# Patient Record
Sex: Female | Born: 1945 | Race: Black or African American | Hispanic: No
Health system: Southern US, Community
[De-identification: ages and names within clinical notes are randomized; demographics above are authoritative.]

---

## 2004-03-13 ENCOUNTER — Ambulatory Visit: Payer: Self-pay

## 2004-10-07 ENCOUNTER — Ambulatory Visit: Payer: Self-pay

## 2004-11-16 ENCOUNTER — Ambulatory Visit: Payer: Self-pay

## 2004-12-29 ENCOUNTER — Ambulatory Visit: Payer: Self-pay | Admitting: Rheumatology

## 2005-03-05 ENCOUNTER — Ambulatory Visit: Payer: Self-pay | Admitting: Gastroenterology

## 2005-06-03 ENCOUNTER — Ambulatory Visit: Payer: Self-pay | Admitting: Gastroenterology

## 2006-01-06 ENCOUNTER — Ambulatory Visit: Payer: Self-pay | Admitting: Specialist

## 2007-10-03 ENCOUNTER — Ambulatory Visit: Payer: Self-pay | Admitting: Family Medicine

## 2008-03-11 ENCOUNTER — Ambulatory Visit: Payer: Self-pay | Admitting: Family Medicine

## 2009-03-13 ENCOUNTER — Ambulatory Visit: Payer: Self-pay | Admitting: Specialist

## 2009-04-16 ENCOUNTER — Ambulatory Visit: Payer: Self-pay | Admitting: Family Medicine

## 2009-05-02 ENCOUNTER — Ambulatory Visit: Payer: Self-pay | Admitting: Family Medicine

## 2009-07-09 ENCOUNTER — Ambulatory Visit: Payer: Self-pay | Admitting: Specialist

## 2009-07-11 ENCOUNTER — Ambulatory Visit: Payer: Self-pay | Admitting: Specialist

## 2009-08-19 ENCOUNTER — Ambulatory Visit: Payer: Self-pay | Admitting: Family Medicine

## 2009-11-17 ENCOUNTER — Ambulatory Visit: Payer: Self-pay | Admitting: Specialist

## 2010-01-13 ENCOUNTER — Ambulatory Visit: Payer: Self-pay | Admitting: Specialist

## 2011-04-22 ENCOUNTER — Ambulatory Visit: Payer: Self-pay | Admitting: Internal Medicine

## 2011-09-14 LAB — COMPREHENSIVE METABOLIC PANEL
Anion Gap: 13 (ref 7–16)
BUN: 11 mg/dL (ref 7–18)
Bilirubin,Total: 0.7 mg/dL (ref 0.2–1.0)
Calcium, Total: 8.7 mg/dL (ref 8.5–10.1)
EGFR (Non-African Amer.): >60
Glucose: 126 mg/dL — ABNORMAL HIGH (ref 65–99)
Osmolality: 267 (ref 275–301)
SGOT(AST): 17 U/L (ref 15–37)
Sodium: 133 mmol/L — ABNORMAL LOW (ref 136–145)

## 2011-09-14 LAB — CBC
HCT: 38 % (ref 35.0–47.0)
MCV: 78 fL — ABNORMAL LOW (ref 80–100)
WBC: 8.9 10*3/uL (ref 3.6–11.0)

## 2011-09-14 LAB — CK TOTAL AND CKMB (NOT AT ARMC): CK-MB: <0.5 ng/mL — ABNORMAL LOW (ref 0.5–3.6)

## 2011-09-15 ENCOUNTER — Inpatient Hospital Stay: Payer: Self-pay | Admitting: *Deleted

## 2011-09-15 LAB — URINALYSIS, COMPLETE
Blood: NEGATIVE
Protein: 100
Specific Gravity: 1.029 (ref 1.003–1.030)
Squamous Epithelial: 1
WBC UR: 13 /HPF (ref 0–5)

## 2011-09-16 LAB — COMPREHENSIVE METABOLIC PANEL
Anion Gap: 11 (ref 7–16)
BUN: 8 mg/dL (ref 7–18)
Bilirubin,Total: 0.4 mg/dL (ref 0.2–1.0)
Chloride: 99 mmol/L (ref 98–107)
Co2: 25 mmol/L (ref 21–32)
Creatinine: 0.64 mg/dL (ref 0.60–1.30)
EGFR (African American): >60
Glucose: 97 mg/dL (ref 65–99)
Osmolality: 268 (ref 275–301)
Potassium: 3 mmol/L — ABNORMAL LOW (ref 3.5–5.1)
SGOT(AST): 13 U/L — ABNORMAL LOW (ref 15–37)
Sodium: 135 mmol/L — ABNORMAL LOW (ref 136–145)
Total Protein: 6.5 g/dL (ref 6.4–8.2)

## 2011-09-16 LAB — CBC WITH DIFFERENTIAL/PLATELET
Eosinophil #: 0.1 10*3/uL (ref 0.0–0.7)
HCT: 32.4 % — ABNORMAL LOW (ref 35.0–47.0)
HGB: 10.5 g/dL — ABNORMAL LOW (ref 12.0–16.0)
Lymphocyte %: 15.6 %
MCH: 25 pg — ABNORMAL LOW (ref 26.0–34.0)
MCHC: 32.4 g/dL (ref 32.0–36.0)
MCV: 77 fL — ABNORMAL LOW (ref 80–100)
RBC: 4.2 10*6/uL (ref 3.80–5.20)
RDW: 15.7 % — ABNORMAL HIGH (ref 11.5–14.5)
WBC: 5.3 10*3/uL (ref 3.6–11.0)

## 2011-09-16 LAB — OCCULT BLOOD X 1 CARD TO LAB, STOOL: Occult Blood, Feces: NEGATIVE

## 2011-09-16 LAB — IRON AND TIBC
Iron Bind.Cap.(Total): 213 ug/dL — ABNORMAL LOW (ref 250–450)
Iron Saturation: 20 %
Unbound Iron-Bind.Cap.: 171 ug/dL

## 2011-09-16 LAB — URINE CULTURE

## 2011-09-19 LAB — BASIC METABOLIC PANEL
Anion Gap: 10 (ref 7–16)
BUN: 6 mg/dL — ABNORMAL LOW (ref 7–18)
Calcium, Total: 8.8 mg/dL (ref 8.5–10.1)
Chloride: 102 mmol/L (ref 98–107)
Co2: 20 mmol/L — ABNORMAL LOW (ref 21–32)
Creatinine: 0.61 mg/dL (ref 0.60–1.30)
EGFR (African American): >60
EGFR (Non-African Amer.): >60
Glucose: 92 mg/dL (ref 65–99)

## 2011-09-20 LAB — CULTURE, BLOOD (SINGLE)

## 2011-09-20 LAB — HEMOGLOBIN: HGB: 10.6 g/dL — ABNORMAL LOW (ref 12.0–16.0)

## 2012-01-26 ENCOUNTER — Ambulatory Visit: Payer: Self-pay | Admitting: Ophthalmology

## 2012-03-08 ENCOUNTER — Ambulatory Visit: Payer: Self-pay | Admitting: Ophthalmology

## 2012-07-24 ENCOUNTER — Inpatient Hospital Stay: Payer: Self-pay | Admitting: Specialist

## 2012-07-24 LAB — COMPREHENSIVE METABOLIC PANEL
Anion Gap: 9 (ref 7–16)
Bilirubin,Total: 0.4 mg/dL (ref 0.2–1.0)
Calcium, Total: 9 mg/dL (ref 8.5–10.1)
Co2: 25 mmol/L (ref 21–32)
EGFR (Non-African Amer.): >60
Glucose: 86 mg/dL (ref 65–99)
Osmolality: 270 (ref 275–301)
Potassium: 3.4 mmol/L — ABNORMAL LOW (ref 3.5–5.1)
SGOT(AST): 21 U/L (ref 15–37)
SGPT (ALT): 13 U/L (ref 12–78)
Sodium: 135 mmol/L — ABNORMAL LOW (ref 136–145)
Total Protein: 8.3 g/dL — ABNORMAL HIGH (ref 6.4–8.2)

## 2012-07-24 LAB — CBC WITH DIFFERENTIAL/PLATELET
Basophil %: 0.2 %
Eosinophil #: 0 10*3/uL (ref 0.0–0.7)
HCT: 45.5 % (ref 35.0–47.0)
HGB: 15 g/dL (ref 12.0–16.0)
Lymphocyte %: 4.2 %
MCHC: 32.9 g/dL (ref 32.0–36.0)
MCV: 82 fL (ref 80–100)
Monocyte %: 3 %
Neutrophil %: 92.5 %
RDW: 15.3 % — ABNORMAL HIGH (ref 11.5–14.5)

## 2012-07-24 LAB — URINALYSIS, COMPLETE
Blood: NEGATIVE
Nitrite: NEGATIVE
RBC,UR: 2 /HPF (ref 0–5)
Specific Gravity: 1.027 (ref 1.003–1.030)
WBC UR: 16 /HPF (ref 0–5)

## 2012-07-25 LAB — CBC WITH DIFFERENTIAL/PLATELET
Basophil %: 0.3 %
Eosinophil #: 0.1 10*3/uL (ref 0.0–0.7)
Eosinophil %: 0.4 %
HGB: 9.5 g/dL — ABNORMAL LOW (ref 12.0–16.0)
Lymphocyte #: 1.3 10*3/uL (ref 1.0–3.6)
MCH: 26 pg (ref 26.0–34.0)
MCV: 81 fL (ref 80–100)
Neutrophil #: 12 10*3/uL — ABNORMAL HIGH (ref 1.4–6.5)
Neutrophil %: 85.7 %
Platelet: 283 10*3/uL (ref 150–440)
RBC: 3.67 10*6/uL — ABNORMAL LOW (ref 3.80–5.20)

## 2012-07-25 LAB — BASIC METABOLIC PANEL
Anion Gap: 9 (ref 7–16)
BUN: 8 mg/dL (ref 7–18)
Calcium, Total: 8.2 mg/dL — ABNORMAL LOW (ref 8.5–10.1)
Chloride: 106 mmol/L (ref 98–107)
Co2: 23 mmol/L (ref 21–32)
Glucose: 52 mg/dL — ABNORMAL LOW (ref 65–99)
Potassium: 3.6 mmol/L (ref 3.5–5.1)
Sodium: 138 mmol/L (ref 136–145)

## 2012-07-26 LAB — CBC WITH DIFFERENTIAL/PLATELET
Basophil #: 0 10*3/uL (ref 0.0–0.1)
HCT: 29.2 % — ABNORMAL LOW (ref 35.0–47.0)
HGB: 9.5 g/dL — ABNORMAL LOW (ref 12.0–16.0)
MCH: 26.4 pg (ref 26.0–34.0)
MCHC: 32.7 g/dL (ref 32.0–36.0)
Monocyte #: 0.8 x10 3/mm (ref 0.2–0.9)
Neutrophil #: 7.5 10*3/uL — ABNORMAL HIGH (ref 1.4–6.5)
Platelet: 294 10*3/uL (ref 150–440)
RBC: 3.6 10*6/uL — ABNORMAL LOW (ref 3.80–5.20)
RDW: 15.2 % — ABNORMAL HIGH (ref 11.5–14.5)
WBC: 9.5 10*3/uL (ref 3.6–11.0)

## 2012-07-27 LAB — CLOSTRIDIUM DIFFICILE BY PCR

## 2012-07-27 LAB — URINE CULTURE

## 2012-07-30 LAB — CULTURE, BLOOD (SINGLE)

## 2012-08-01 LAB — CULTURE, BLOOD (SINGLE)

## 2012-08-06 ENCOUNTER — Inpatient Hospital Stay: Payer: Self-pay | Admitting: Internal Medicine

## 2012-08-06 LAB — COMPREHENSIVE METABOLIC PANEL
Albumin: 3.3 g/dL — ABNORMAL LOW (ref 3.4–5.0)
Alkaline Phosphatase: 49 U/L — ABNORMAL LOW (ref 50–136)
Anion Gap: 13 (ref 7–16)
BUN: 14 mg/dL (ref 7–18)
Bilirubin,Total: 0.5 mg/dL (ref 0.2–1.0)
Calcium, Total: 9.1 mg/dL (ref 8.5–10.1)
Chloride: 96 mmol/L — ABNORMAL LOW (ref 98–107)
Co2: 21 mmol/L (ref 21–32)
EGFR (African American): >60
EGFR (Non-African Amer.): >60
Potassium: 3.5 mmol/L (ref 3.5–5.1)
SGOT(AST): 15 U/L (ref 15–37)
SGPT (ALT): 14 U/L (ref 12–78)
Total Protein: 7.8 g/dL (ref 6.4–8.2)

## 2012-08-06 LAB — URINALYSIS, COMPLETE
Bilirubin,UR: NEGATIVE
Nitrite: NEGATIVE
RBC,UR: 2 /HPF (ref 0–5)

## 2012-08-06 LAB — CBC
HCT: 37.9 % (ref 35.0–47.0)
MCH: 26.2 pg (ref 26.0–34.0)
MCHC: 32.3 g/dL (ref 32.0–36.0)
MCV: 81 fL (ref 80–100)
Platelet: 346 10*3/uL (ref 150–440)
RBC: 4.68 10*6/uL (ref 3.80–5.20)
RDW: 16.3 % — ABNORMAL HIGH (ref 11.5–14.5)
WBC: 6.9 10*3/uL (ref 3.6–11.0)

## 2012-08-06 LAB — TROPONIN I: Troponin-I: <0.02 ng/mL

## 2012-08-07 LAB — BASIC METABOLIC PANEL
Anion Gap: 6 — ABNORMAL LOW (ref 7–16)
BUN: 9 mg/dL (ref 7–18)
Creatinine: 0.56 mg/dL — ABNORMAL LOW (ref 0.60–1.30)
Osmolality: 264 (ref 275–301)
Sodium: 133 mmol/L — ABNORMAL LOW (ref 136–145)

## 2012-08-07 LAB — MAGNESIUM: Magnesium: 1.8 mg/dL

## 2012-08-12 ENCOUNTER — Emergency Department: Payer: Self-pay | Admitting: Emergency Medicine

## 2012-08-12 LAB — URINALYSIS, COMPLETE
Bilirubin,UR: NEGATIVE
Glucose,UR: >=500 mg/dL (ref 0–75)
Leukocyte Esterase: NEGATIVE
Ph: 7 (ref 4.5–8.0)
Protein: NEGATIVE
RBC,UR: NONE SEEN /HPF (ref 0–5)
Specific Gravity: 1.009 (ref 1.003–1.030)
Squamous Epithelial: 3
WBC UR: 1 /HPF (ref 0–5)

## 2012-08-12 LAB — COMPREHENSIVE METABOLIC PANEL
Albumin: 3.3 g/dL — ABNORMAL LOW (ref 3.4–5.0)
Alkaline Phosphatase: 59 U/L (ref 50–136)
Anion Gap: 6 — ABNORMAL LOW (ref 7–16)
BUN: 8 mg/dL (ref 7–18)
Bilirubin,Total: 0.4 mg/dL (ref 0.2–1.0)
Calcium, Total: 8.7 mg/dL (ref 8.5–10.1)
Chloride: 99 mmol/L (ref 98–107)
Creatinine: 0.57 mg/dL — ABNORMAL LOW (ref 0.60–1.30)
EGFR (African American): >60
Glucose: 145 mg/dL — ABNORMAL HIGH (ref 65–99)
Osmolality: 267 (ref 275–301)
Potassium: 3.3 mmol/L — ABNORMAL LOW (ref 3.5–5.1)
SGOT(AST): 20 U/L (ref 15–37)
SGPT (ALT): 12 U/L (ref 12–78)
Sodium: 133 mmol/L — ABNORMAL LOW (ref 136–145)

## 2012-08-12 LAB — CBC
MCHC: 32.6 g/dL (ref 32.0–36.0)
MCV: 82 fL (ref 80–100)
Platelet: 234 10*3/uL (ref 150–440)
RBC: 4.59 10*6/uL (ref 3.80–5.20)
WBC: 7.5 10*3/uL (ref 3.6–11.0)

## 2012-08-30 ENCOUNTER — Other Ambulatory Visit: Payer: Self-pay | Admitting: Unknown Physician Specialty

## 2012-08-30 DIAGNOSIS — R1310 Dysphagia, unspecified: Secondary | ICD-10-CM

## 2012-08-31 ENCOUNTER — Ambulatory Visit
Admission: RE | Admit: 2012-08-31 | Discharge: 2012-08-31 | Disposition: A | Discharge status: Home / Self Care | Payer: Medicare Other | Source: Ambulatory Visit | Attending: Unknown Physician Specialty | Admitting: Unknown Physician Specialty

## 2012-08-31 DIAGNOSIS — R1310 Dysphagia, unspecified: Secondary | ICD-10-CM

## 2012-10-12 ENCOUNTER — Ambulatory Visit: Payer: Self-pay | Admitting: Unknown Physician Specialty

## 2012-10-15 LAB — PATHOLOGY REPORT

## 2012-11-07 ENCOUNTER — Emergency Department: Payer: Self-pay | Admitting: Emergency Medicine

## 2012-11-07 LAB — CBC
HCT: 38.3 % (ref 35.0–47.0)
HGB: 12.6 g/dL (ref 12.0–16.0)
MCHC: 32.8 g/dL (ref 32.0–36.0)
MCV: 83 fL (ref 80–100)
WBC: 6.7 10*3/uL (ref 3.6–11.0)

## 2012-11-07 LAB — COMPREHENSIVE METABOLIC PANEL
Albumin: 3.4 g/dL (ref 3.4–5.0)
Alkaline Phosphatase: 53 U/L (ref 50–136)
Anion Gap: 10 (ref 7–16)
Bilirubin,Total: 0.5 mg/dL (ref 0.2–1.0)
Calcium, Total: 9.5 mg/dL (ref 8.5–10.1)
Chloride: 97 mmol/L — ABNORMAL LOW (ref 98–107)
Co2: 26 mmol/L (ref 21–32)
EGFR (African American): >60
EGFR (Non-African Amer.): >60
Glucose: 102 mg/dL — ABNORMAL HIGH (ref 65–99)
Potassium: 3.8 mmol/L (ref 3.5–5.1)
SGOT(AST): 14 U/L — ABNORMAL LOW (ref 15–37)
Sodium: 133 mmol/L — ABNORMAL LOW (ref 136–145)

## 2012-11-07 LAB — URINALYSIS, COMPLETE
Nitrite: NEGATIVE
Ph: 7 (ref 4.5–8.0)
Protein: 30
RBC,UR: 1 /HPF (ref 0–5)
Squamous Epithelial: 7
WBC UR: 5 /HPF (ref 0–5)

## 2012-11-08 ENCOUNTER — Observation Stay: Payer: Self-pay | Admitting: Internal Medicine

## 2012-11-09 DIAGNOSIS — R9431 Abnormal electrocardiogram [ECG] [EKG]: Secondary | ICD-10-CM

## 2012-11-09 LAB — URINE CULTURE

## 2013-01-24 DEATH — deceased

## 2013-05-23 IMAGING — CR DG CHEST 1V PORT
1 series · 1 of 1 positions shown · non-contrast
Comparison: none

REASON FOR EXAM: FTT, RECENT PNA
COMMENTS:

[ap]
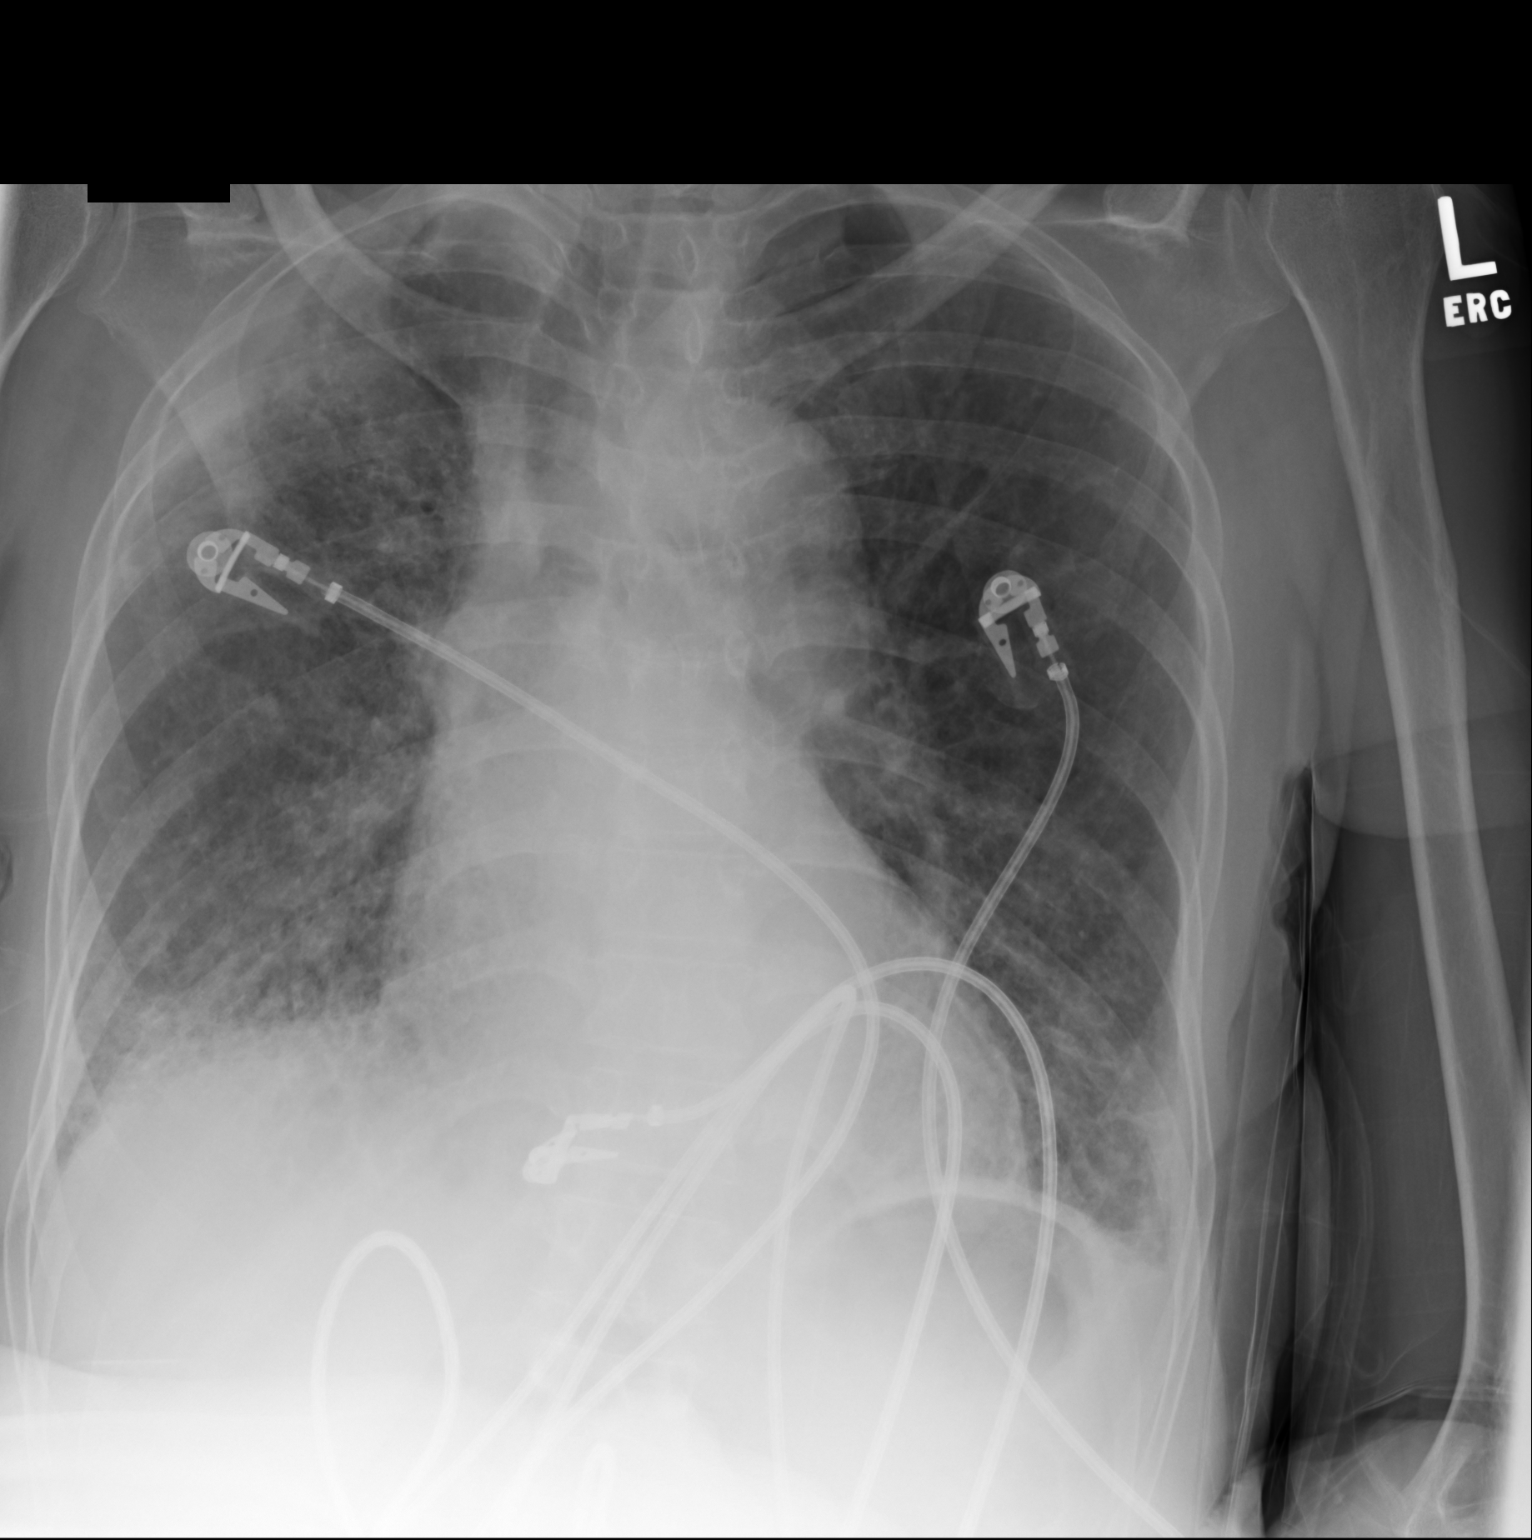

[1 of 1 positions shown; findings below may reference images not displayed]

PROCEDURE:     DXR - DXR PORTABLE CHEST SINGLE VIEW  - August 12, 2012  [DATE]

RESULT:     Comparison is made to the earlier study dated 25 July, 2012.
There are patchy areas of density in the left lung base, right upper lobe
and right lung base. The heart size is normal. Monitoring electrodes are
present. The bones are osteopenic. The patient has undergone previous CT of
the chest on 17 September, 2011. That showed chronic lung disease especially in the
right lower lobe and right upper lobe with some involvement in the left
lower lobe and scattered areas otherwise diffusely. The changes likely
represent chronic bronchitis with possible bronchiectasis. Some superimposed
acute infectious or inflammatory process in these areas is not excluded.
Continued followup is recommended.
IMPRESSION: Please see above.

[REDACTED]

## 2013-06-11 IMAGING — RF DG ESOPHAGUS
11 series · 11 of 11 positions shown · non-contrast
Comparison: None.

CLINICAL DATA: Dysphagia.  Difficulty swallowing pills.

ESOPHAGUS/BARIUM SWALLOW/TABLET STUDY
Fluoroscopy Time: 0 minutes 36 seconds.

[Series 1: run · 1 of 1 slices shown (1 of 11)]
[im 1/1]
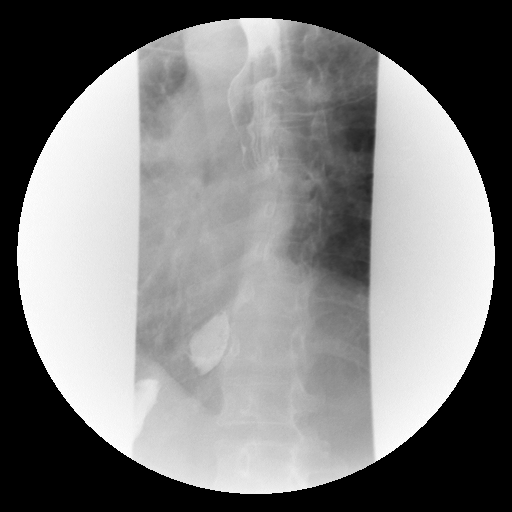

[Series 2: run · 1 of 1 slices shown (2 of 11)]
[im 1/1]
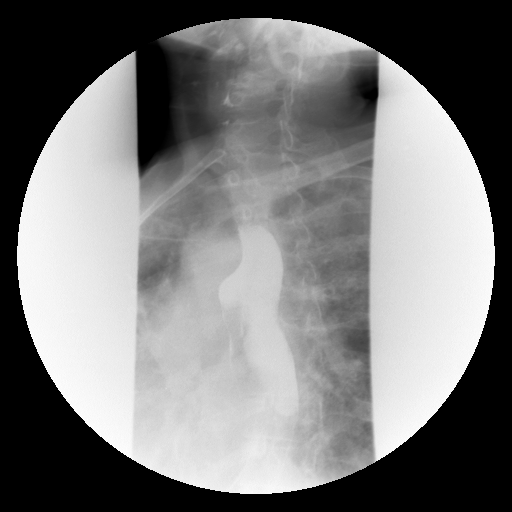

[Series 3: run · 1 of 1 slices shown (3 of 11)]
[im 1/1]
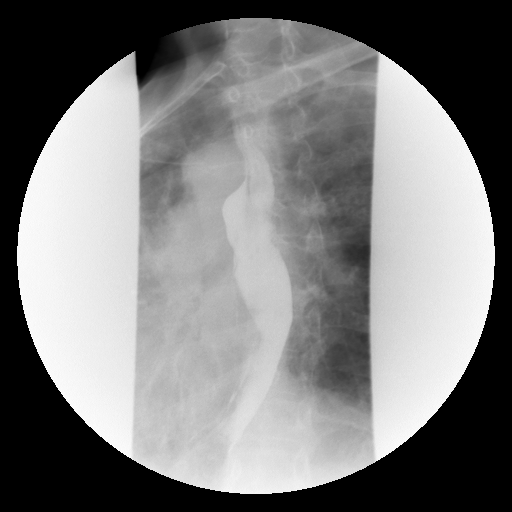

[Series 4: run · 1 of 1 slices shown (4 of 11)]
[im 1/1]
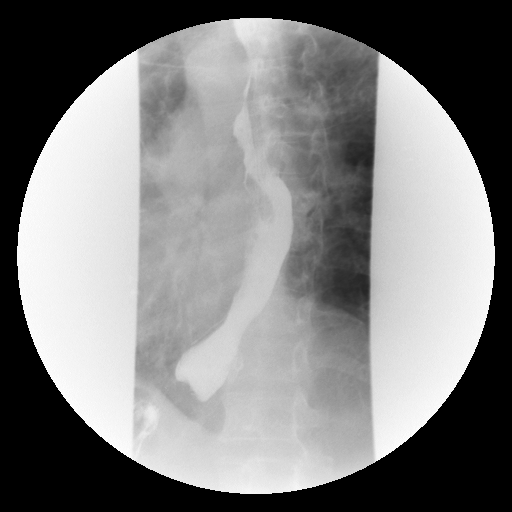

[Series 5: run · 1 of 1 slices shown (5 of 11)]
[im 1/1]
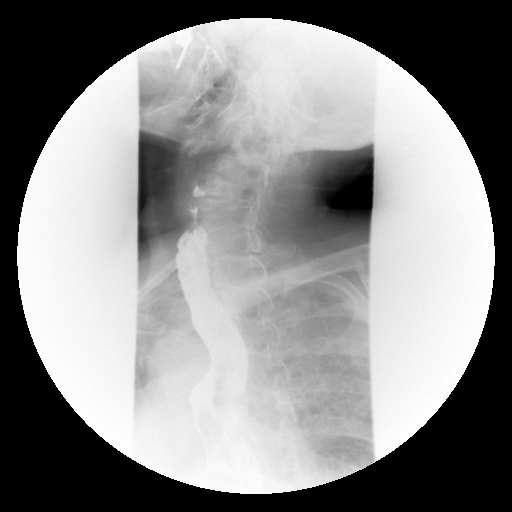

[Series 6: run · 1 of 1 slices shown (6 of 11)]
[im 1/1]
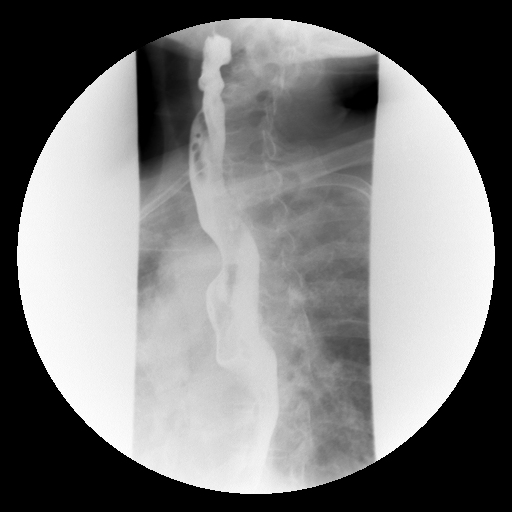

[Series 7: run · 1 of 1 slices shown (7 of 11)]
[im 1/1]
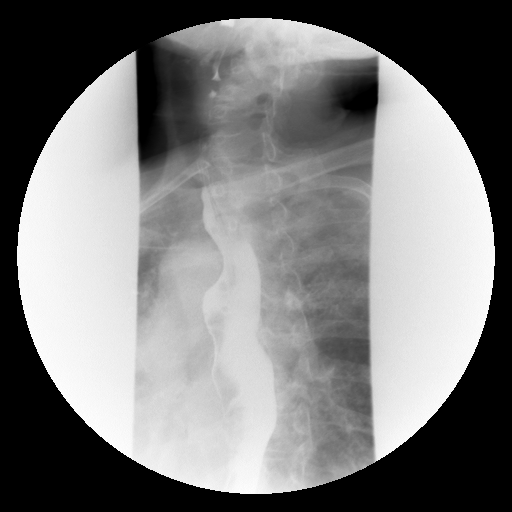

[Series 8: run · 1 of 1 slices shown (8 of 11)]
[im 1/1]
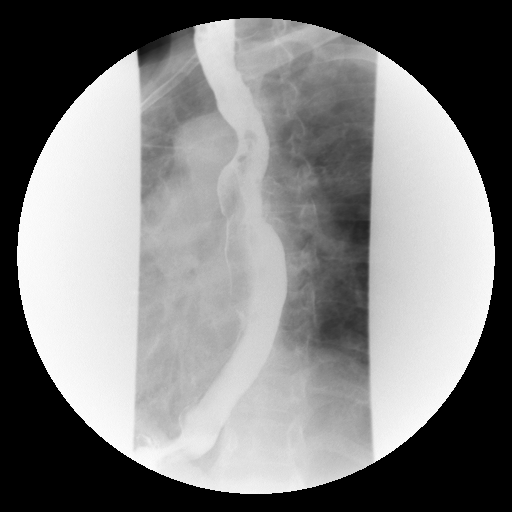

[Series 9: run · 1 of 1 slices shown (9 of 11)]
[im 1/1]
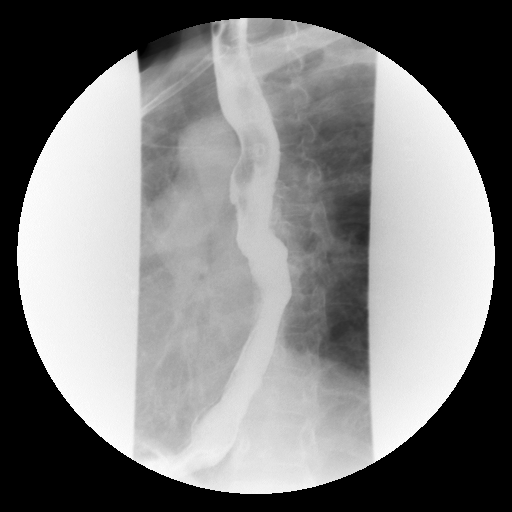

[Series 10: run · 1 of 1 slices shown (10 of 11)]
[im 1/1]
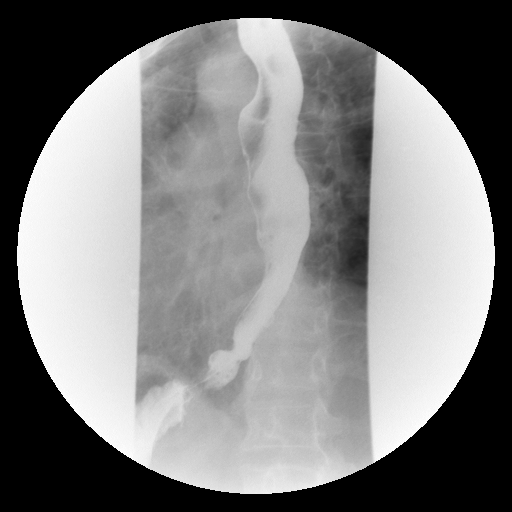

[Series 11: run · 1 of 1 slices shown (11 of 11)]
[im 1/1]
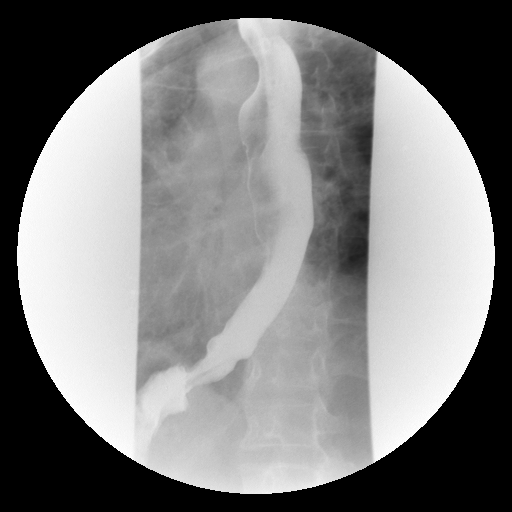

[11 of 11 positions shown; findings below may reference images not displayed]

FINDINGS: A single contrast examination was performed.  Esophageal
motility is normal.  No esophageal fold thickening, stricture or
obstruction.  The patient was unable to swallow a barium tablet.
IMPRESSION: Normal esophagram with exception of the patient being unable to
swallow a barium tablet.

## 2014-08-16 NOTE — Discharge Summary (Signed)
PATIENT NAME:  Teresa Chen, Teresa Chen MR#:  [id number] DATE OF BIRTH:  01-06-1946  DATE OF ADMISSION:  11/08/2012 DATE OF DISCHARGE:   11/09/2012  PRIMARY CARE PHYSICIAN:  Dr. Elizabeth Sauereanna Jones   FINAL DIAGNOSES: 1.  Falls and weakness, transfer over to rehab. Stop statin.  2.  Inflammatory myositis mixed connective tissue disorder, interstitial lung disease on chronic oxygen and chronic respiratory failure.  3.  Peripheral neuropathy.  4.  History of vasculitis. 5.  Hypertension tachycardia.  6.  Diabetes.  7.  Osteoporosis.  8.  Peptic ulcer disease.  9.  Weight loss and failure to thrive.   HOSPITAL COURSE:  The patient was admitted as an observation on 11/08/2012, looks like second time back into the hospital, 15th and 16th, came in with recurrent falls. The patient was unsafe at home.  LABORATORY AND RADIOLOGICAL DATA DURING THE HOSPITAL COURSE:  Included 2 CAT scans of the head that was negative. EKG showed a sinus tachycardia, left atrial enlargement, anterior septal infarct. Glucose 102, BUN 9, creatinine 0.68, sodium 133, potassium 3.8, chloride 97, CO2 26, calcium 9.5. Liver function tests normal range. White blood cell count 6.7, H and H 12.6 and 38.3, platelet count of 222. Urinalysis positive, 1+ leukocyte esterase. Urine culture showed mixed contamination. Troponin was negative. Sugar 66.   ASSESSMENT AND PLAN: 1.  Frequent falls and weakness:  The patient was admitted as an observation. Rehabilitation beds did accept the patient. This could be a combination of things that is causing her frequent falls. I will stop the statin and see if that helps out. Could be the inflammatory myositis with muscle wasting itself, could be chronic steroid weakness. She is on chronic steroids and could also be complicated by the peripheral neuropathy. Would benefit from rehabilitation and hopefully will be safe to go home after the rehabilitation; if not, will need longer term placement.  2.  Inflammatory  myositis mixed connective tissue disorder, interstitial lung disease and chronic respiratory failure. The patient is on oxygen 2 liters continuance.  She is on chronic steroids, 5 mg of prednisone daily and Plaquenil. Will continue those medications. Try to taper prednisone slowly over the next few years, may end up going down to 4 mg at some point.  3.  Peripheral neuropathy, which could be complicating the falls.  4.  History of vasculitis on low-dose prednisone.  5.  History of hypertension and tachycardia. Will try low-dose Cardizem CD.  6.  Diabetes:  As per the son, has history of hypoglycemia. Will stop all diabetic medications and just check fingersticks twice a day. The patient has to eat.  7.  Osteoporosis on Fosamax.  8.  Peripheral vascular disease on a proton pump inhibitor.  9.  Weight loss and failure to thrive:  The patient was started on Ensure here. I will start Remeron at night. Hopefully, will increase appetite and mood.  10.  Possible urinary tract infection:  Will complete course of Cipro even though grew out contamination.  MEDICATIONS ON DISCHARGE: Include: 1.  Prednisone 5 mg daily. 2.  Remeron 7.5 mg at bedtime. 3.  Plaquenil 200 mg daily. 4.  Spiriva 1 inhalation daily. 5.  Alendronate 70 mg a week on Tuesday. 6.  Cardizem CD 120 mg extended-release daily. 7.  Omeprazole 20 mg twice a day. 8.  Cipro 500 mg every 12 hours. Stop after 12 doses. 9.  Ensure Plus 237 mL 3 times a day.   DRESSING CARE: Remove staples and head in 6  days. Home oxygen 2 liters nasal cannula.   DIET:  Regular diet. Ensure supplements 3 times a day, regular consistency.   ACTIVITY:  As tolerated with physical therapy.  FOLLOWUP:  Doctor at rehab in 1 to 2 days.  TIME SPENT ON DISCHARGE:  35 minutes.     ____________________________ Herschell Dimes. Renae Gloss, MD rjw:ce D: 11/09/2012 12:18:47 ET T: 11/09/2012 12:33:14 ET JOB#: [id number]  cc: Herschell Dimes. Renae Gloss, MD, <Dictator> Duanne Limerick, MD [employer]  Salley Scarlet MD ELECTRONICALLY SIGNED 11/10/2012 17:20

## 2014-08-16 NOTE — H&P (Signed)
PATIENT NAME:  Teresa Chen, Teresa Chen MR#:  [id number] DATE OF BIRTH:  [birth year]  DATE OF ADMISSION:  11/08/2012  PRIMARY CARE PHYSICIAN: Elizabeth Sauer, MD  CHIEF COMPLAINT: Recurrent falls.   HISTORY OF PRESENTING ILLNESS: History has been obtained mostly from family at bedside and old records. The patient does talk but chose to not answer most of the questions and is pleasantly confused at times.   The patient is a 69 year old African American female patient with history of mixed connective tissue disease, inflammatory myositis, interstitial lung disease and peripheral neuropathy, presents to the Emergency Room with recurrent falls. The patient had fallen yesterday night and also earlier today morning was brought to the Emergency Room, was diagnosed with a UTI, was sent home but again fell backwards and is in the Emergency Room with a laceration on the back of her head. The patient has had dementia, is being seen by Neurology as outpatient and under workup for her dementia. The patient does remember her falling backwards today but seems to be pleasantly confused at times. She is not oriented to time.   She has lost more than 20 pounds in the past couple of months, as per family members, and has poor appetite, has not been eating well, has had minimal fluid intake.   The patient is being worked up for her weight loss, had an EGD in June 2014 which showed a Schatzki ring and had esophageal dilation.  Since then, her dysphagia is resolved, but poor appetite still continues. She has been diagnosed with dementia, started on Aricept 1 week back but since then has been more confused, and Aricept has been stopped today by Dr. Malvin Johns of Neurology.   The patient is being admitted to hospitalist service for recurrent falls, UTI.   PAST MEDICAL HISTORY: 1.  Mixed connective tissue disease.  2.  Inflammatory myositis.  3.  Peripheral neuropathy.  4.  Interstitial lung disease.  5.  Synovitis.  6.  Vasculitis of  lower extremities.  7.  Microcytic anemia.  8.  Hypertension.  9.  Diabetes.  10.  Osteoporosis.  11.  GERD. 12.  Peptic ulcer disease.  13.  Weight loss.  14.  Dysphagia.  15.  Dementia.   PAST SURGICAL HISTORY: Right knee fracture surgery.   SOCIAL HISTORY: The patient used to smoke and drink alcohol in the past but quit many years back. She lives at home with her family, walks with a cane which she does not like using.   CODE STATUS: DNR/DNI.   ALLERGIES: PENICILLIN.   HOME MEDICATIONS: 1.  Spiriva 18 mcg inhaled once a day.  2.  Prednisone 5 mg oral daily.  3.  Esomeprazole 20 mg oral 2 times a day.  4.  Metformin 5009 mg oral 2 times a day.  5.  Lovastatin 20 mg oral daily.  6.  Losartan 50 mg oral daily.  7.  Lisinopril 5 mg oral daily.  8.  Leflunomide 20 mg oral daily.  9.  Hydroxychloroquine 200 mg oral once a day.  10.  Alendronate 70 mg oral once a day.   REVIEW OF SYSTEMS:  Unobtainable secondary to the patient's confusion.   PHYSICAL EXAMINATION: VITAL SIGNS: Temperature 97.8, pulse 95, respirations 16, blood pressure 114/75, saturating 96% on room air.  GENERAL: A pleasant African American female patient lying in bed, cachectic. Cooperative with exam.  PSYCHIATRIC: Alert, awake, oriented to person and place but not to time.  HEENT: Atraumatic, normocephalic. Oral mucosa dry and pink.  No oral ulcers or thrush. External ears and nose normal. No pallor. No icterus. Pupils bilaterally equal and reactive to light.  NECK: Supple. No thyromegaly. No palpable lymph nodes. Trachea midline. No carotid bruit or JVD.  CARDIOVASCULAR: S1, S2 without any murmurs. Peripheral pulses 2+.  RESPIRATORY: Normal work of breathing. Clear to auscultation on both sides.  GASTROINTESTINAL: Soft abdomen, nontender. Bowel sounds present. No hepatosplenomegaly palpable.  SKIN: Warm and dry. No petechiae, rash, ulcers.  MUSCULOSKELETAL: No joint swelling, redness, effusion of the large  joints. Normal muscle tone. Has diffuse muscle wasting.  GENITOURINARY: No CVA tenderness or bladder distention.  NEUROLOGICAL: Motor strength 5-/5 in upper and lower extremities. Sensation to fine touch  intact all over.   SKIN: Has scalp laceration which has been sutured in the ER.   LABORATORY AND DIAGNOSTIC DATA:  Glucose 102, BUN 9, creatinine 0.68, sodium 133, potassium 3.8, chloride 97, GFR greater than 60. AST, ALT, alkaline phosphatase, bilirubin normal. Troponin less than 0.02. WBC 6.7, hemoglobin 12.6, platelets of 222. Urinalysis shows trace bacteria and 5 WBCs.  EKG shows normal sinus rhythm with diffuse T wave inversions which seem to be new compared to old EKG.  CT scan of the head shows mild atrophy. No acute abnormalities. No acute bleeds or strokes.   ASSESSMENT AND PLAN: 1.  Recurrent falls in a patient with significant muscle wasting, weakness and weight loss. The patient also has peripheral neuropathy complicating to her falls. The patient does not like using her cane and tends to forget it at times, which is contributing to the falls. The patient is unsafe at home with her recurrent falls and also has had a scalp laceration. We will admit the patient, have Physical Therapy see the patient, also consult case manager for placement. The patient has been instructed to ask for help when she gets up to prevent any falls. I have also advised the family to stay at bedside for family actually to prevent any inpatient delirium.  2.  Weight loss. This has been worked up as outpatient, and the physician thinks it is likely secondary from dementia.  The patient is young at [age].  Her weight loss could also be secondary to her chronic disease with mixed connective tissue disorder, poor appetite.  There seems to be a component of depression contributing to this. I will have Psychiatry see the patient.  We will start her on Lexapro in the meanwhile. I have explained to the family that treatment of  depression takes a while.  3.  Diabetes. We will hold metformin.  Blood sugars seem to be controlled at 102. The patient has had poor appetite and weight loss.  4.  Interstitial lung disease. Continue the Spiriva, DuoNeb p.r.n. and prednisone from home.  5.  Inflammatory myositis. Continue home medications.  6.  Deep vein thrombosis prophylaxis with Lovenox.  7.  Acute T wave inversions.  The patient does not complain of any chest pain or shortness of breath, but these T wave inversions are new. I will check 2 more sets of cardiac enzymes and repeat an EKG in the morning. Family does not want any aggressive surgeries or treatments.   CODE STATUS: DNR/DNI as discussed with the patient and family at bedside.   TIME SPENT: Today on this case was 50 minutes.  ____________________________ Molinda BailiffSrikar R. Meric Joye, MD srs:cb D: 11/08/2012 15:24:35 ET T: 11/08/2012 16:06:47 ET JOB#: [id number]  cc: Wardell HeathSrikar R. Elpidio AnisSudini, MD, <Dictator> Duanne Limerickeanna C. Jones, MD Orie FishermanSRIKAR R Manon Banbury MD  ELECTRONICALLY SIGNED 11/12/2012 23:40

## 2014-08-16 NOTE — H&P (Signed)
DATE OF BIRTH:  08-Nov-1945  DATE OF ADMISSION:  07/24/2012  PRIMARY CARE PHYSICIAN:  Dr. Elizabeth Sauer  CHIEF COMPLAINT:  Cough, weakness, decreased appetite.   HISTORY OF PRESENT ILLNESS:  A 69 year old female patient with history of interstitial lung disease, on steroids, diabetes, hypertension, presents to the hospital with one week of cough, decreased appetite. The patient's shortness of breath is not worse. She does not complain of any chest pain, and greenish-looking sputum on coughing. She has had sick contacts at home. A chest x-ray shows bilateral pneumonitis, also has a UTI. The patient has been hypotensive in the 80s, in spite of getting IV fluids, her blood pressure has not significantly improved. She is being admitted to the hospitalist service with sepsis.   PAST MEDICAL HISTORY: 1.  Mixed connective tissue disease, positive rheumatoid factor, ANA.  2.  Inflammation myositis.  3.  Peripheral neuropathy.  4.  Interstitial lung disease.  5.  Intramedullary synovitis.  6.  Vasculitis of lower extremities.  7.  Nodules.  8.  Microcytic anemia.  9.  Diabetes.  10.  Hypertension.  11.  Osteoporosis.  12.  GERD.    PAST SURGICAL HISTORY:   Right knee fracture surgery.   ALLERGIES:  PENICILLIN, which causes rash.   SOCIAL HISTORY:  The patient used to smoke and drink alcohol, but does not anymore. Lives with her daughter.   HOME MEDICATIONS INCLUDE:  1.  Alendronate 70 mg oral once a week.  2.  Glipizide 10 mg oral once a week.  3.  Hydroxychloroquine 200 mg oral once a day.  4.  Leflunomide 20 mg oral once a day. 5.  Lisinopril 5 mg oral once a day.  6.  Losartan 50 mg oral once a day.  7.  Lovastatin 20 mg oral once a day. 8.  Metformin 5 mg oral 2 times a day.  9.  Omeprazole 20 mg oral 2 times a day.  10.  Prednisone 5 mg oral once a day.  11. Spiriva 80 mcg inhaled once a day.   REVIEW OF SYSTEMS:  CONSTITUTIONAL:  No fever, but has fatigue, weakness. No weight  loss, weight gain.  EYES:  No blurry vision, pain, or redness.  EARS, NOSE, THROAT:  No tinnitus, ear pain, hearing loss.  RESPIRATORY:  Has green productive cough, chronic shortness of breath secondary to interstitial lung disease.  CARDIOVASCULAR:  No chest pain, orthopnea, edema.  GASTROINTESTINAL:  No nausea, vomiting, diarrhea, abdominal pain.  GENITOURINARY:  No dysuria, hematuria, frequency.  ENDOCRINE:  No polyuria, nocturia, thyroid problems.  HEMATOLOGIC/LYMPHATIC:  No anemia, easy bruising, bleeding.  INTEGUMENTARY:  No acne, rash, lesions.  MUSCULOSKELETAL:  No back pain, arthritis.  NEUROLOGIC:  No focal numbness, weakness, dysarthria.  PSYCHIATRIC:  No anxiety or depression   FAMILY HISTORY:  Reviewed, unknown.   PHYSICAL EXAMINATION: VITAL SIGNS:  Temperature 97.6, pulse of 122, now improved into the 90s, respirations 20, blood pressure 80/54, saturating 99% on 2 liters oxygen.  GENERAL: Thin, African-American female patient lying in bed, comfortable, conversational, cooperative with exam.  PSYCHIATRIC:  Alert and oriented x 3. Mood and affect appropriate. Judgment intact.  HEENT:  Atraumatic, normocephalic. Oral mucosa dry and pink. External ears and nose normal. No pallor. No icterus. Pupils bilaterally equal and react to light. No oral ulcers or thrush.  NECK:  Supple. No thyromegaly. No palpable lymph nodes. Trachea midline. No carotid bruits or JVD.  CARDIOVASCULAR:  S1, S2. Regular rate and rhythm without any murmurs. Peripheral  pulses 2+. No edema.  RESPIRATORY:  Bilateral crackles, more on the right than on the left, both inspiratory and expiratory.  GASTROINTESTINAL: Soft abdomen, nontender. Bowel sounds present. No hepatosplenomegaly palpable.  GENITOURINARY:  No CVA tenderness or bladder distention.  SKIN:  Warm and dry. No petechiae, rash, ulcers.  MUSCULOSKELETAL:  No joint tenderness, redness, effusion of the large joints. Normal muscle tone.  NEUROLOGICAL:   Motor strength 5 out of 5 in upper and lower extremities. Sensation to fine touch  intact all over. Cranial nerves II through XII intact.  LYMPHATIC:  No cervical lymphadenopathy.   LAB STUDIES:  Glucose 86, BUN 13, creatinine 0.70, sodium 135, potassium 3.4, chloride 101, bicarb 25, albumin 2.5. AST, ALT, alkaline phosphatase normal. WBC 12.4, hemoglobin 15, platelets 231. Urinalysis shows 16 WBC and 2+ bacteria.   Chest x-ray shows chronic interstitial lung disease changes along with bilateral pneumonitis.   ASSESSMENT AND PLAN: 1.  Bilateral pneumonia in a patient with prior interstitial lung disease, on steroids. Will admit the patient for IV antibiotics. The patient also has sepsis with hypotension, elevated white count. Will need IV fluids. Await blood and sputum cultures. Continue the oxygen and nebulizers p.r.n.  2.  Sepsis due to above.  3.  Urinary tract infection, on antibiotics. Await cultures.  4.  Interstitial lung disease, seems stable. Continue home oxygen dose.  5.  Diabetes mellitus. Continue home medications along with sliding scale insulin, ADA diet.  6.  Hypertension. Will hold medication secondary to hypotension. Can resume once blood pressure is improved.  7.  Deep vein thrombosis prophylaxis with Lovenox.   CODE STATUS:  FULL CODE.   Time spent today on this case was 40 minutes.     ____________________________ Molinda BailiffSrikar R. Flo Berroa, MD srs:mr D: 07/24/2012 19:22:12 ET T: 07/24/2012 20:31:42 ET JOB#: [id number]  cc: Wardell HeathSrikar R. Elpidio AnisSudini, MD, <Dictator> Duanne Limerickeanna C. Jones, MD   Orie FishermanSRIKAR R Comfort Iversen MD ELECTRONICALLY SIGNED 07/31/2012 14:59

## 2014-08-16 NOTE — Consult Note (Signed)
PATIENT NAME:  Teresa Chen, LOVERN MR#:  [id number] DATE OF BIRTH:  03-27-1946  DATE OF CONSULTATION:  07/25/2012  REFERRING PHYSICIAN:  Dr. Jacques Navy.  CONSULTING PHYSICIAN:  Rosalyn Gess. Blakelyn Dinges, MD  REASON FOR CONSULTATION: Gram-negative rod bacteremia.   HISTORY OF PRESENT ILLNESS: The patient is a 69 year old female with a past history significant for diabetes and interstitial lung disease, on chronic steroids and oxygen, who was admitted yesterday with an approximately 3-week history of increased lethargy and decreased appetite. The patient and her daughter state that over the last several weeks, she has had worsening awareness and decreased appetite. She has had some cough with some greenish sputum production. She has baseline shortness of breath that is not particularly worse. She was hypotensive on admission to the hospital and received IV fluids. Her blood cultures are growing a gram-negative rod, and chest x-ray is showing a possible infiltrate, although today's film demonstrates a possible pulmonary mass. She feels somewhat better since being in the hospital. On admission, she was started on ceftriaxone and azithromycin.   ALLERGIES: Include PENICILLIN.   PAST MEDICAL HISTORY: 1.  Interstitial lung disease, on chronic steroids and oxygen.  2.  Mixed connective tissue disease.  3.  Inflammatory myositis.  4.  Peripheral neuropathy.  5.  Lower extremity vasculitis.  6.  Diabetes.  7.  Hypertension.  8.  Osteoporosis.  9.  GERD.   SOCIAL HISTORY: The patient previously lived with her daughter. Her daughter has moved out and her son and spouse now live with her. She has no pets at home. She is a prior smoker. She is a prior drinker, but quit several years ago.   FAMILY HISTORY: Prostate cancer in a brother, spina bifida in a daughter.   REVIEW OF SYSTEMS:  GENERAL: No fevers, chills, or sweats. No malaise. Positive fatigue.  HEENT: No headaches. Some chronic sinus congestion. No nasal  congestion. No sore throat.  NECK: No stiffness. No swollen glands.  RESPIRATORY: Positive cough with some greenish sputum production. Positive chronic shortness of breath that is no worse than her baseline. No wheezing.  CARDIAC: No chest pains or palpitations.  GASTROINTESTINAL: No nausea, no vomiting, no abdominal pain, no change in her bowels.  GENITOURINARY: No change in her urine.  MUSCULOSKELETAL: No muscle or joint complaints.  SKIN: No rashes.  NEUROLOGIC: No focal weakness. She had increasing lethargy recently.  PSYCHIATRIC: No complaints.   All other systems are negative.   PHYSICAL EXAMINATION: VITAL SIGNS: T-max of 98.2, T-current of 97.8, pulse of 91, blood pressure 92/64; 100% on  2 liters.  GENERAL: A 69 year old thin, black female in no acute distress.  HEENT: Normocephalic, atraumatic. Pupils equal and reactive to light. Extraocular motion intact. Sclerae, conjunctivae, and lids are without evidence for emboli or petechiae. Oropharynx shows no erythema or exudate. Several teeth are missing. Gums are in fair condition.  NECK: Supple. Full range of motion. Midline trachea. No lymphadenopathy. No thyromegaly.  LUNGS: Some crackles bilaterally. No consolidation. No tachypnea. She is able to speak in full sentences.  CARDIAC: Regular rate and rhythm without murmur, rub, or gallop.  ABDOMEN: Soft, nontender, and nondistended. No hepatosplenomegaly. No hernia is noted.  EXTREMITIES: No evidence for tenosynovitis. She does have changes consistent with arthritis in her hands, with ulnar deviation of her fingers at the MCP joints.  SKIN: No rashes. No stigmata of endocarditis, specifically no Janeway lesions or Osler nodes.  NEUROLOGIC: The patient is awake and interactive, moving all 4 extremities.  PSYCHIATRIC:  Mood and affect appeared normal.   LABORATORY DATA: BUN of 8, creatinine 0.48, bicarbonate of 23, anion gap of 9. LFTs were unremarkable. White count of 14.0, with a  hemoglobin of 9.5, platelet count of 283, ANC of 12.0. White count on admission was 12.4. Blood cultures are growing a gram-negative rod, the identification of which is pending. Urinalysis had negative nitrites, trace leukocyte esterase, 2 red cells and 16 white cells. Urine culture is pending. Chest x-ray on admission showed a possible infiltrate. Followup chest x-ray today suggests a possible pulmonary mass in the upper lobe.   IMPRESSION: A 69 year old female with a past history significant for diabetes and interstitial lung disease, on chronic steroids and oxygen, who was admitted with pneumonia and gram-negative rod bacteremia.   RECOMMENDATIONS: 1.  Her chest x-ray shows a possible infiltrate. This is likely the source of her gram-negative rod. She appears to be improving since she was admitted.  2. Today's chest x-ray shows a possible mass, which was not commented on yesterday. Would consider a CT scan to better clarify this. Will defer to the primary team.  3.  Will continue the ceftriaxone. This will cover most non-pseudomonal gram-negative rods.  4. Will stop the azithromycin. In pneumonia, this is used to provide double coverage for pneumococcus, which is not a gram-negative rod.  5. Will await the final ID of the gram-negative rod in the blood.  6.  Will repeat a CBC in morning.   This is a moderately complex infectious disease consult. Thank you very much for involving me in this patient's care.     ____________________________ Rosalyn GessMichael E. Andriy Sherk, MD meb:dm D: 07/25/2012 13:25:00 ET T: 07/25/2012 13:55:34 ET JOB#: [id number]  cc: Rosalyn GessMichael E. Merci Walthers, MD, <Dictator> Alyx Gee E Shequila Neglia MD ELECTRONICALLY SIGNED 07/26/2012 8:36

## 2014-08-16 NOTE — Op Note (Signed)
PATIENT NAME:  Leeanne MannanLOVE, Paytience G MR#:  [id number] DATE OF BIRTH:  07-Aug-1945  DATE OF PROCEDURE:  07/25/2012  PREOPERATIVE DIAGNOSIS:  Bilateral pneumonia.   POSTOPERATIVE DIAGNOSIS:  Bilateral pneumonia.   PROCEDURE PERFORMED:   1.  Access to the right basilic vein.  2.  Venous catheter, 5 French dual-lumen PICC line, right arm.   SURGEON:  Levora DredgeGregory Anona Giovannini, MD   INDICATION FOR PROCEDURE:  Requiring IV antibiotics greater than 5 days.   DESCRIPTION OF PROCEDURE:  The patient's right arm was sterilely prepped and draped, and a sterile surgical field was created. The basilic vein was accessed under direct ultrasound guidance without difficulty with a micropuncture needle and permanent image was recorded. 0.018 wire was then placed into the superior vena cava. Peel-away sheath was placed over the wire. A single lumen peripherally inserted central venous catheter was then placed over the wire and the wire and peel-away sheath were removed. The catheter tip was placed into the superior vena cava and was secured at the skin at 31 cm with a sterile dressing. The catheter withdrew blood well and flushed easily with heparinized saline. The patient tolerated procedure well.   ____________________________ Renford DillsGregory G. Izzabell Klasen, MD ggs:si D: 07/25/2012 17:07:32 ET T: 07/25/2012 22:49:21 ET JOB#: [id number]  cc: Renford DillsGregory G. Cobey Raineri, MD, <Dictator> Renford DillsGREGORY G Vivica Dobosz MD ELECTRONICALLY SIGNED 09/04/2012 13:39

## 2014-08-16 NOTE — H&P (Signed)
DATE OF BIRTH:  1945-11-03  DATE OF ADMISSION:  08/06/2012  PRIMARY CARE PHYSICIAN:  Dr. Yetta BarreJones REFERRING PHYSICIAN:  Dr. Ladona Ridgelaylor  CHIEF COMPLAINT:  Nausea and vomiting for 5 days.   HISTORY OF PRESENT ILLNESS:  A 69 year old African-American female with a history of GERD, PUD, hypertension, diabetes, presented in the ED with above chief complaint. Actually patient was diagnosed with pneumonia, was hospitalized one week ago and discharged to home. Patient developed nausea and vomiting after discharge to home. Patient could not eat anything, even a liquid diet. She threw up whatever she eats. She lost 8 pounds for the past one week and feels dehydrated and weak, dizzy, but she denies any abdominal pain. No diarrhea. No melena or bloody stool. The patient's blood pressure was  80s in the ED, was treated with a normal saline bolus. Blood pressure increased to 99 just now. The patient has interstitial lung disease, on home oxygen.  PAST MEDICAL HISTORY:  Mixed connective tissue disease, inflammation, myositis, peripheral neuropathy, interstitial lung disease, synovitis, vasculitis of lower extremities, macrocytic anemia, hypertension, diabetes, osteoporosis, GERD, PUD.    PAST SURGICAL HISTORY:  Right knee fracture surgery.   SOCIAL HISTORY:  The patient used to smoke and drink alcohol, but quit many years ago.   ALLERGIES:  PENICILLIN.   HOME MEDICATIONS:   1.  Spiriva 18 mcg inhalation once a day.  2.  Prednisone 5 mg p.o. daily.  3.  Omeprazole 20 mg p.o. b.i.d.  4.  Metformin 500 mg p.o. b.i.d.  5.  Lovastatin 20 mg p.o. daily.  6.  Losartan 50 mg p.o. daily.  7.  Lisinopril 5 mg p.o. daily.  8.  Leflunomide 20 mg p.o. daily.   9.  Hydroxychloroquine 200 mg p.o. once a day.  10.  Glipizide 10 mg p.o. daily.  11.  Alendronate 70 mg p.o. once a week.   REVIEW OF SYSTEMS:    CONSTITUTIONAL: The patient denies any fever or chills. No headache, but has dizziness, weakness and weight loss.   EYES:  Has no double vision or blurred vision.  EARS, NOSE, THROAT:  No postnasal drip, slurred speech or dysphagia. No epistaxis.  CARDIOVASCULAR: No chest pain, palpitation, orthopnea, nocturnal dyspnea. No leg edema.  PULMONARY:  Has a cough, no sputum, shortness of breath or hematemesis.  GASTROINTESTINAL:  No abdominal pain, but has nausea/vomiting. No diarrhea, melena or bloody stool.  GENITOURINARY:  No dysuria, hematuria or incontinence.  SKIN:  No rash or jaundice, but has dry skin.  NEUROLOGIC:  No syncope, loss of consciousness or seizure.  HEMATOLOGIC:  No easy bruising, bleeding.   LABORATORY DATA:  Glucose 64, BUN 14, creatinine 0.67, sodium 130, potassium 3.5, chloride 96, bicarb 21, bilirubin 0.5. CBC is normal.   EKG shows sinus tachycardia at 116 BPM.  VITAL SIGNS:  Temperature 98.4, blood pressure was 80s and now is 99/65, pulse 99, O2 saturation 98% oxygenation by nasal cannula.   PHYSICAL EXAMINATION: GENERAL:  The patient is alert, awake, oriented, in no acute distress.  HEENT:  Pupils round, equal, react to light and accommodation. Dry oral mucosa. Clear oropharynx.  NECK:  Supple. No JVD or carotid bruits. No lymphadenopathy, no thyromegaly.  CARDIOVASCULAR:  S1, S2. Regular rate and rhythm. No murmurs or gallops.  PULMONARY:  Bilateral air entry. No wheezing, rales. No use of accessory muscles to breathe.  ABDOMEN:  Soft. No distention. No tenderness. No organomegaly. Bowel sounds present.  EXTREMITIES:  No edema, clubbing or cyanosis. No  calf tenderness. Strong bilateral pedal pulses.  SKIN:  No rash or jaundice, but has dry skin turgor.  NEUROLOGIC: A and O x 3. No focal deficit. Power 5/5. Sensation intact.   IMPRESSIONS:  1.  Hypotension, possibly due to dehydration.  2.  Nausea, vomiting, possibly due to gastroesophageal reflux disease or peptic ulcer disease.  3.  Dehydration.  4.  Hyponatremia.  5.  Hypoglycemia.  6.  Diabetes.  7.  History of  hypertension.  8.  Interstitial lung disease, on home oxygen.   PLAN OF TREATMENT: 1.  The patient will be kept n.p.o., except will give Zofran p.r.n. for nausea and vomiting. Continue IV fluid support. Follow up BMP. Hold lisinopril and losartan. We will get a GI consult for peptic ulcer disease and possible endoscopy, and start Protonix.   2.   For diabetes, will hold Glipizide and start a sliding scale. Check hemoglobin A1c.   3.  For interstitial lung disease, we will continue Spiriva, continue oxygen by nasal cannula.   4.  GI and DVT prophylaxis.   Discussed the patient's condition and plan of treatment with patient and the patient's son.   TIME SPENT:  About 58 minutes.    ____________________________ Shaune Pollack, MD qc:mr D: 08/06/2012 18:39:00 ET T: 08/06/2012 19:25:39 ET JOB#: [id number]  cc: Shaune Pollack, MD, <Dictator> Shaune Pollack MD ELECTRONICALLY SIGNED 08/06/2012 28:41

## 2014-08-16 NOTE — Discharge Summary (Signed)
PATIENT NAME:  Teresa Chen, Teresa Chen DATE OF BIRTH:  1945-10-28  DATE OF ADMISSION:  08/06/2012 DATE OF DISCHARGE:  08/07/2012  PRIMARY CARE PHYSICIAN:  Elizabeth Sauereanna Jones, MD  DISCHARGE DIAGNOSES: 1.  Viral gastroenteritis.  2.  Dehydration.  3.  Hyponatremia.  4.  Hypoglycemia.  5.  Hypovolemic hypotension.  6.  Interstitial lung disease with chronic respiratory failure.   CONSULTS: None.   ADMITTING HISTORY AND PHYSICAL AND HOSPITAL COURSE: Please see detailed H and P dictated by Dr. Imogene Burnhen. In brief, this is a 69 year old African American female patient with history of GERD and interstitial lung disease who presented to the hospital complaining of nausea and vomiting of 5 days. The patient had significantly improved by the time she presented to the ER but was feeling lightheaded, was severely dehydrated, and was admitted to the hospitalist service with hypotension.   HOSPITAL COURSE: The patient was admitted onto a medical floor and started on aggressive IV fluid resuscitation with which her blood pressure improved. Antihypertensives have been held as she normally tends to run low-normal blood pressure and is on both ACE and ARB which was stopped at the time of discharge. The patient's viral gastroenteritis has resolved. She is afebrile. White count is in the normal range. The case was discussed with the patient and her family members at bedside who verbalize understanding and will follow up with primary care physician.   On the day of discharge, the patient's abdominal examination shows no tenderness, soft, bowel sounds present with a blood pressure prior to discharge of 93/67, which is her baseline, and discharged home in fair condition.    DISCHARGE MEDICATIONS: 1.  Leflunomide 20 mg oral once a day.  2.  Prednisone 5 mg oral once a day.  3.  Omeprazole 20 mg oral 2 times a day. 4.  Glipizide 10 mg oral once a day.  5.  Alendronate 70 mg oral once a week.  6.  Spiriva 18 mcg  inhaled once a day.  7.  Metformin 500 mg oral 2 times a day.  8.  Hydroxychloroquine 200 mg oral once a day.  9.  Lovastatin 20 mg oral once a day.  10.  Zofran ODT 4 mg oral 3 times a day as needed for nausea and vomiting.   DISCHARGE INSTRUCTIONS: The patient was discharged home on a low-fat, low-cholesterol diet. Activity as tolerated. Follow up with primary care physician in 1 to 2 weeks. Continue oxygen at 2 L/min.   TIME SPENT: On day of discharge and discharge activity was 38 minutes.  ____________________________ Molinda BailiffSrikar R. Maye Parkinson, MD srs:sb D: 08/08/2012 14:47:00 ET T: 08/08/2012 14:55:55 ET JOB#: [id number]  cc: Wardell HeathSrikar R. Elpidio AnisSudini, MD, <Dictator> Duanne Limerickeanna C. Jones, MD Orie FishermanSRIKAR R Shelia Kingsberry MD ELECTRONICALLY SIGNED 08/10/2012 13:00

## 2014-08-16 NOTE — Consult Note (Signed)
Impression:     69yo female with DM and interstitial lung disease on chronic steroids and oxygen admitted with pneumonia and GNR bacteremia.     Her CXR shows possible infiltrate.  This is the likely source of her GNR.  She appears to be improving since she was admitted. 2)     Today's CXR shows a possible mass which was not commented on yesterday.  Would consider a CT scan to better clarify this.  Will defer to primary team. 3)     Will continue the ceftriaxone.  This will cover most non-Pseudomonal GNRs.3)     Will stop the azithro.  This is to provide double coverage for Pneumococcus which is not a GNR. 54)     Will await the final ID of the GNR in the blood. 65)     Will repeat the CBC in am.   Electronic Signatures: Demitrus Francisco MPH, Rosalyn GessMichael E (MD) (Signed on 01-Apr-14 13:10)  Authored   Last Updated: 01-Apr-14 13:25 by Cesareo Vickrey MPH, Rosalyn GessMichael E (MD)

## 2014-08-16 NOTE — Consult Note (Signed)
Brief Consult Note: Comments: Pt is already discharged before she can be evaluated for depression / wt loss.  Electronic Signatures: Rhunette CroftFaheem, Sanya Kobrin S (MD)  (Signed 17-Jul-14 15:07)  Authored: Brief Consult Note   Last Updated: 17-Jul-14 15:07 by Rhunette CroftFaheem, Tamiki Kuba S (MD)

## 2014-08-16 NOTE — Discharge Summary (Signed)
PATIENT NAME:  Teresa Chen, Teresa Chen MR#:  [id number] DATE OF BIRTH:  03/04/1946  DATE OF ADMISSION:  07/24/2012 DATE OF DISCHARGE:  07/28/2012  For a detailed note, please take a look at the history and physical done on admission by Dr. Elpidio Chen.   DIAGNOSES AT DISCHARGE:  Is as follows:  Multifocal pneumonia suspected to be pneumococcal in nature.  Pulmonary fibrosis on home oxygen.  Sepsis secondary to pneumococcal pneumonia and urinary tract infection.  Diabetes.  Hypertension.  Osteoporosis.  Rheumatoid arthritis.   DIET:  The patient is being discharged on a low-sodium, low-fat, carb-controlled diet.   ACTIVITY:  As tolerated.   FOLLOW-UP:  With Dr. Elizabeth Sauereanna Chen in the next 1 to 2 weeks.   DISCHARGE MEDICATIONS:  Leflunomide 20 mg daily, prednisone 5 mg daily, losartan 50 mg daily, omeprazole 20 mg daily, glipizide 10 mg daily, Fosamax 70 mg weekly, Spiriva 1 puff daily, metformin 500 mg twice daily, Plaquenil 200 mg daily, lisinopril 5 mg daily, lovastatin 20 mg daily and Levaquin 750 mg daily x 10 days.   CONSULTANTS DURING THE HOSPITAL COURSE:  Dr. Casimiro NeedleMichael Chen from infectious disease.   PERTINENT STUDIES DONE DURING THE HOSPITAL COURSE:  A chest x-ray done on admission showing chronic interstitial lung disease possible with some superimposed early left lung base pneumonia.  A CT of the chest done with contrast on April 1st showing marked abnormality throughout the lung, but especially in the lower lobe posteriorly with milder findings of left lower lobe.  This is consistent with pulmonary fibrosis and bronchiectasis, likely with superimposed pneumonia.  No suspicious masses noted.  No bulky mediastinal hilar lymphadenopathy.  A blood culture to be positive for strep pneumoniae.  Urine culture to be positive for Escherichia coli.  Sputum culture noted to be polymicrobial growing Klebsiella Acinetobacter, Enterobacter and MSSA.  All sensitive to Levaquin.   HOSPITAL COURSE:  This is a  69 year old female with a history of diabetes, hypertension, chronic lung disease secondary to pulmonary fibrosis on home O2 presented to the hospital with cough, weakness, and poor appetite and chest x-ray findings suggestive of pneumonia.  1.  Bilateral pneumonia.  This was noticed on the CT chest as mentioned.  Her sputum cultures grew out polymicrobial including MSSA Enterobacter, Acinetobacter and Klebsiella.  Although one of her blood cultures grew out strep pneumo.  Therefore the likely source is probably strep pneumo.  She was initially treated with IV ceftriaxone, but currently is being discharged on by mouth Levaquin.  She was seen in consultation by Dr. Orson AloeMichael Chen from infectious disease who agreed with this management and thought she would benefit from at least 14 days of total IV antibiotic therapy given her bacteremia.  Her O2 requirements have remained stable.  She will continue home oxygen which she already is on for her chronic lung disease and follow up with Dr. Meredeth Chen as an outpatient.  2.  Sepsis.  This was likely secondary to pneumonia and urinary tract infection.  The patient had a urine culture positive for Escherichia coli and sputum culture positive for polymicrobial as mentioned along with a blood culture positive for strep pneumo.  Initially, patient was treated with IV ceftriaxone, but currently is being discharged on by mouth Levaquin.  She will receive a total of 14 days of therapy.  She has received four days of IV antibiotic therapy while in the hospital and she will take 10 more days of antibiotics as mentioned.  3.  Urinary tract infection.  This  was secondary to Escherichia coli.  It was sensitive to Levaquin.  She will finish the antibiotic therapy as stated.  4.  Pulmonary fibrosis with chronic interstitial lung disease.  The patient will continue to follow up with Dr. Meredeth Chen as an outpatient.  She is already on oxygen at home.  She will continue her maintenance  prednisone as stated.  5.  Diabetes.  The patient had no evidence of any hypoglycemic episodes.  She will continue her metformin and glipizide.  6.  Hypertension.  Her antihypertensives were initially held during the hospital course given the fact that she had sepsis, although she will resume her lisinopril upon discharge.   CODE STATUS:  THE PATIENT IS A FULL CODE.   She is being discharged home.  The family is in agreement with this plan.   TIME SPENT:  Is 40 minutes.     ____________________________ Teresa Chen. Teresa Kaiser, MD vjs:ea D: 07/28/2012 17:26:01 ET T: 07/29/2012 04:22:55 ET JOB#: [id number]  cc: Teresa Chen. Teresa Kaiser, MD, <Dictator> Teresa Limerick, MD Teresa Siren MD ELECTRONICALLY SIGNED 08/01/2012 13:46

## 2014-08-18 NOTE — Consult Note (Signed)
Pt stool showed no blood.  Will plan to do EGD as outpt a few weeks after discharge, prefer to postpone til pneumonia healed.  Electronic Signatures: Scot JunElliott, Robert T (MD)  (Signed on 23-May-13 17:17)  Authored  Last Updated: 23-May-13 17:17 by Scot JunElliott, Robert T (MD)

## 2014-08-18 NOTE — Discharge Summary (Signed)
PATIENT NAME:  Teresa Chen, Teresa Chen MR#:  [id number] DATE OF BIRTH:  [birth year]  DATE OF ADMISSION:  09/15/2011 DATE OF DISCHARGE:  09/20/2011  DISCHARGE DIAGNOSES:  1. Pneumonia with underlying interstitial lung disease. 2. Chronic obstructive pulmonary disease. 3. Chronic respiratory failure, uses 2 liters of oxygen at home. 4. Diabetes. 5. Hypertension. 6. Mixed connective tissue disorder.  7. Malnutrition, moderate-to-severe.    8. Peripheral neuropathy. 9. Anemia. 10. Osteoporosis.   CONSULTATIONS: Dr. Mechele Collin.   HOSPITAL COURSE: The patient is a 69 year old female who has chronic obstructive pulmonary disease with chronic respiratory failure. 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 She follows with Dr. Lavenia Atlas. She is on chronic steroids, prednisone 5 mg daily. She also has interstitial lung disease, pulmonary fibrosis, diabetes, hypertension. She presented with fever and cough. The patient was sent by her primary care physician to the Emergency Room with suspected pneumonia. When she came to the Emergency Room, her temperature was 102.5. Please refer to the History and Physical by  Dr. Winona Legato at the time of admission. The patient was admitted as pneumonia with underlying pulmonary fibrosis. She also had suspected urinary tract infection when she came in. So, at admission she had a normal white count of 8.9. Her chest x-ray was reported as extensive chronic interstitial lung densities suggesting fibrosis, cannot exclude superimposed acute bronchitis and pneumonitis, but no focal pneumonia. The patient was started on IV Levaquin. Her urinalysis when she came in showed some 3+ leukocyte esterase and 13 WBCs per high-power field. Because she continued to spike fever with Levaquin, the Levaquin was changed to doxycycline, and she responded to doxycycline. Her fevers have resolved right now. Her cough has resolved. She also had a CT of the chest  done without contrast which shows diffuse pulmonary interstitial lung disease and bronchiectasis. Findings of superimposed pneumonitis or interstitial lung disease cannot be excluded. We will give her a course of doxycycline at home. Gastroenterology was consulted at admission because the patient had postprandial nausea, vomiting, dysphagia, and weight loss. She was also taking a lot of Goody Powders at home, so they suspect peptic ulcer disease. She has been started on PPI and Carafate, and she is going to follow up with GI outpatient endoscopy after her pneumonia has resolved. She is tolerating a diet right now.   Also during the hospital stay, she was drowsy and she had slight change in speech or slurred speech; so an ABG was done on May 25th that shows pH of 7.44, pCO2 of 27, and pO2 of 122 on 28% FiO2, so she was not hypercapnic. Also a CT of the head was done, and the CT head shows chronic involutional changes but no acute abnormality, some questionable mastoiditis acute on chronic on the left. Her speech has improved right now. The daughter thinks that her speech change could be secondary to the Megace that was started at admission, and we are going to stop that. Considering that she has history of diabetes and hypertension, I am going to start her on low-dose Ecotrin for stroke prevention.   DISCHARGE MEDICATIONS:  Home medications: 1. Alendronate 70 mg once a week.  2. Spiriva HandiHaler 1 puff once daily.  3. Plaquenil 200 mg daily.  4. Leflunomide 20 mg daily.  5. Prednisone 5 mg daily. 6. Losartan 50 mg daily. 7. Advair Diskus 250/50 b.i.d.  8. Lovastatin 20 mg daily.   New medications:  1. Doxycycline 100 mg p.o. b.i.d. for 6 days.  2. Omeprazole 20 mg p.o. b.i.d.  3. Carafate 1 gram p.o. t.i.d. before meals.  4. Change glipizide to 10 mg p.o. once daily. She was taking twice a day at home, and her sugars are actually stable.  5. Ecotrin 81 mg p.o. once daily.  6. Tessalon Perles  100 mg every 6 hours p.r.n. for cough. 7. Albuterol metered-dose inhaler p.r.n. for wheezing.   NOTE: Do not take BC Powder, Goody Powder, Motrin, ibuprofen, Aleve, any NSAIDs.   HOME OXYGEN: 2 liters per minute, which is her baseline.  DIET: Low sodium, ADA diet.   CONDITION AT DISCHARGE: She is comfortable. She is ambulating around. T-max is 97, heart rate of 90, blood pressure 129/78, saturating 95% on 2 liters nasal cannula. Chest has minimal fine crackles, more like pulmonary fibrosis. Cough has significantly improved.   FOLLOW UP:   1. The patient should follow up with Dr. Elizabeth Sauereanna Jones in one week.  2. Follow up with Pulmonology, Dr. Meredeth IdeFleming, in two weeks, her regular pulmonologist.  3. [facility],  Dr. Mechele CollinElliott in 2 to 3 weeks for an upper GI scope.   TIME SPENT WITH DISCHARGE: 60 minutes. ____________________________ Fredia SorrowAbhinav Khoen Genet, MD ag:cbb D: 09/20/2011 12:26:38 ET T: 09/21/2011 10:12:32 ET JOB#: [id number]  cc: Fredia SorrowAbhinav Ayane Delancey, MD, <Dictator> Duanne Limerickeanna C. Jones, MD Herbon E. Meredeth IdeFleming, MD Scot Junobert T. Elliott, MD Fredia SorrowABHINAV Rogue Rafalski MD ELECTRONICALLY SIGNED 09/28/2011 12:21

## 2014-08-18 NOTE — Consult Note (Signed)
PATIENT NAME:  Teresa Chen, Teresa Chen MR#:  [id number] DATE OF BIRTH:  [birth year]  DATE OF CONSULTATION:  09/15/2011  REFERRING PHYSICIAN:  Katharina Caper, MD  CONSULTING PHYSICIAN:  Lynnae Prude, MD/Venesa Semidey A. Arvilla Market, ANP  REASON FOR CONSULTATION: Pain in the stomach area with weight loss, history of prior gastropathy on EGD in 2006.   HISTORY OF PRESENT ILLNESS: This 69 year old patient of Dr. Elizabeth Sauer has a past medical history of mixed connective tissue disease followed by Dr. Gavin Potters. She also has history of extensive pulmonary fibrosis with COPD and home oxygen followed by Dr. Meredeth Ide. The patient presented to her family doctor with fever and cough, was found to have right lower increased opacity suggestive of pneumonia and was hospitalized. She has had a fever, positive urinary tract infection and is on antibiotic therapy. The patient had been reporting episodes of postprandial nausea, vomiting, and has had poor intake over the last three days. The patient has had gradual weight loss, undisclosed amount, maybe 20 pounds. She is not certain. GI is asked to see the patient regarding further evaluation and management.   The patient is a poor historian. Her daughter is at the bedside and reports her mother has had an undetermined amount of weight loss. She has not been eating well for a long time. The patient states whenever she eats she will feel nauseated and she will throw up so she does not eat often. Daughter reports she has just had a few bites of liquids in the last few days. The patient does think that food hangs up somewhat but she is not sure that it's regurgitation or heaving from her stomach. She has noted no hematemesis. Reports constipation and did not have a bowel movement since last week until she was given a laxative today and passed a good amount of brown stool. Currently she is pain free and denies abdominal pain. She has had heartburn for years and does report chronic omeprazole  therapy.   Previous EGD performed 03/05/2005 for dysphagia, heartburn, and weight loss revealed a nonobstructing web in the upper third of the esophagus, minimal nonobstructing B-ring seen in the distal third, diffuse erythematous mucosa without bleeding found in the entire examined stomach. Multiple dispersed erosion without bleeding was found on the greater curvature of the stomach. There was no stigmata of recent bleeding. Biopsies were taken. There was a small sliding hiatus hernia found. A colonoscopy performed 06/03/2005 for screening purposes revealed multiple small-mouthed diverticulum in the sigmoid colon and in the descending colon. The colonoscope was passed to the cecum and the bowel prep was good.    PAST MEDICAL HISTORY:  1. Mixed connective tissue disease.  2. Microcytic anemia.  3. Bronchiectasis. 4. Emphysema. 5. Chronic obstructive pulmonary disease. 6. Fibrosis.  7. Diabetes mellitus.  8. Reflux esophagitis, previous esophageal web.  9. Osteoporosis.  10. Diverticulosis.  11. Weight loss.  12. Gastroesophageal reflux disease.  PAST SURGICAL HISTORY: Right knee fracture.   MEDICATIONS ON ADMISSION PER H AND P LIST:  1. Advair 250/50 one puff twice daily.  2. Alendronate 70 mg once weekly.  3. Losartan 50 mg daily.  4. Lovastatin 20 mg at bedtime.  5. Metformin 500 mg twice a day.  6. Nasonex spray both nostrils daily.  7. Omeprazole 20 mg daily.  8. Spiriva 18 mcg.  9. Ventolin p.r.n. 3 times daily.  10. Leflunomide 20 mg oral daily.  11. Hydroxychloroquine 200 mg daily.  12. Prednisone 5 mg daily chronically. 13. Alka-Seltzer tablet as  needed. 14. Megace one teaspoon by mouth daily.   ALLERGIES: Penicillin yields rash.   HABITS: Previous tobacco, quit 20 years ago. Negative alcohol or illicit drug use.   SOCIAL HISTORY: Divorced, has four children.   FAMILY HISTORY: Brother had prostate cancer. Daughter with spina bifida. Negative for colon cancer or  colon polyps.   REVIEW OF SYSTEMS: The patient is reluctant and vague historian. She denies any problems except the GI history as noted. Reports her arthritis is stable. No new concerns. Reports her breathing has been stable except she has chronic cough. She is unaware of pneumonia symptoms. Remaining 10 systems reported negative.   PHYSICAL EXAMINATION:   VITAL SIGNS: Temperature 99 to 100, heart rate 113, respirations 23, blood pressure 117/70, oxygen is 97% on 2 liters.   GENERAL: Frail cachectic-looking African American female resting in bed.   HEENT: Head is normocephalic. Temporal wasting noted. Conjunctivae injected. Sclerae anicteric. Oral mucosa is moist and intact.   NECK: Supple. No lymphadenopathy. Trachea is midline.   HEART: Heart tones S1, S2 a little tachycardia noted.   LUNGS: Lungs have breath sounds bilateral, just a few basilar crackles noted. Respirations are nonlabored.   ABDOMEN: Soft, nontender. No HSM or masses.   RECTAL: Deferred.   SKIN: Warm and dry without rash.   MUSCULOSKELETAL: Lower extremities with muscle wasting. Rheumatoid arthritis changes noted to her hands. Elbow nodules noted. Gait not evaluated.   NEUROLOGIC: Cranial nerves II through XII grossly intact. No tremors noted.   PSYCH: Affect is a little flat, says she is tired, slept poorly last night. Cooperative, pleasant, looks at speaker, answers questions when directed. Speech is clear.   LABORATORY DATA: Admission blood work with glucose 126, BUN 11, creatinine 0.68, sodium 133, potassium 3.6, chloride 98, albumin 3.3, total bilirubin 0.7, alkaline phosphatase 57, AST 17, ALT 8. CPK total 78. MB less than 0.5. Troponin less than 0.02. WBC 8.9, hemoglobin 12.3, MCV 78, MCH 25. Urinalysis is cloudy, amber, 3+ leukocyte esterase, WBCs 13 per high-power field, negative nitrite, positive protein and mucus.   RADIOLOGY: Chest x-ray, PA and lateral, performed 09/15/2011 shows lungs are adequately  inflated. Interstitial markings are chronically increased bilaterally. Pleural-based density in the right upper hemithorax laterally is stable. No pleural effusion. Mediastinum normal width. Extensive chronic increased interstitial density suggest fibrosis. Cannot exclude superimposed acute bronchitis or pneumonitis in the appropriate clinical setting.   IMPRESSION:  1. The patient presents with fever 100, cough, shortness of breath, chest x-ray abnormal. There is a pleural-based density right upper hemithorax reportedly stable. The patient has noted no new pulmonary complaints. She does have a positive urinalysis. The patient is on IV Levaquin therapy.  2. Complex medical patient with significant connective tissue disease history and pulmonary history.  3. The patient presents with postprandial nausea, episodes of vomiting, vague, with undisclosed amount of weight loss. She appears malnourished. Previous EGD and colonoscopy as noted. Possibility of recurrence of erosions, gastric ulcer, peptic ulcer disease is high. The patient is with connective tissue disease and possibility of esophageal stricture. Dysmotility risk  is also high.   PLAN: Luminal evaluation when clinically feasible. Recommend PPI therapy twice daily. Will discuss timing of upper endoscopy with Dr. Mechele Collin in collaboration of care.  These services provided by Cala Bradford A. Arvilla Market, ANP under collaborative agreement with Lynnae Prude, MD.   ____________________________ Ranae Plumber. Arvilla Market, ANP kam:drc D: 09/15/2011 16:03:59 ET T: 09/15/2011 16:54:19 ET JOB#: [id number]  cc: Cala Bradford A. Arvilla Market, ANP, <Dictator> Ranae Plumber.  Suzette BattiestMills RN, MSN, ANP-BC Adult Nurse Practitioner ELECTRONICALLY SIGNED 09/16/2011 9:55

## 2014-08-18 NOTE — Consult Note (Signed)
Brief Consult Note: Diagnosis: Weight loss, post prandial nausea/occasional vomiting, suspect dysphagia as well. Active pneumonia, UTI on treatment. Known pulm fibrosis, Lupus, RA.   Patient was seen by consultant.   Consult note dictated.  Electronic Signatures for Addendum Section:  Rowan BlaseMills, Catilyn Boggus Ann (NP) (Signed Addendum [phone]-May-13 18:21)  Case d/w Dr. Mechele CollinElliott and he recommends Protonix IV bid, carafate slurry ac tid -ordered. She may be EGD candidate in a few days.   Electronic Signatures: Rowan BlaseMills, Oluwaseun Bruyere Ann (NP)  (Signed [phone]-May-13 15:06)  Authored: Brief Consult Note   Last Updated: 22-May-13 18:21 by Rowan BlaseMills, Kimika Streater Ann (NP)

## 2014-08-18 NOTE — Consult Note (Signed)
I will sign off, please send to me as out pt 3-4 weeks after discharge.  Dr. Bluford Kaufmannh on this weekend but will not see unless you call him.  Electronic Signatures: Scot JunElliott, Loreena Valeri T (MD)  (Signed on 24-May-13 16:40)  Authored  Last Updated: 24-May-13 16:40 by Scot JunElliott, Jelissa Espiritu T (MD)

## 2014-08-18 NOTE — H&P (Signed)
PATIENT NAME:  Teresa, Chen MR#:  [id number] DATE OF BIRTH:  June 29, 1945  DATE OF ADMISSION:  09/15/2011  PRIMARY CARE PHYSICIAN: Dr. Elizabeth Sauer  REFERRING PHYSICIAN: Dr. Marilynne Halsted  CHIEF COMPLAINT: Fever, cough.   HISTORY OF PRESENT ILLNESS: This is a 69 year old female with significant past medical history of mixed connective tissue disease, mainly lupus and rheumatoid arthritis resulting in extensive pulmonary fibrosis causing chronic respiratory failure where she is on dependent home oxygen 2 liters nasal cannula. Patient represents with complaints to her primary care physician yesterday due to fever and cough. Patient was sent to ED by her primary due to pneumonia. Patient had chest x-ray done in ED which did show right lower increased opacity, as well it did show chronic fibrocystic changes. As well patient was afebrile upon presentation to ED with T-max of 102.5. As well patient reports she has been having fever and chills at home. Her temperature was 100.5 yesterday at home. As well she has been complaining of cough, worsening shortness of breath and productive phlegm. As well patient had been complaining of nausea, vomiting where she would not keep anything in for the last three days. Patient had blood cultures sent, was started on IV Levaquin in ED. As well patient had urinalysis done which did show mild urinary tract infection even though she denies any dysuria or polyuria. As well patient reporting been having poor oral intake in general chronically where she is on Megace for that but it has worsened recently.   PAST MEDICAL HISTORY: 1. Mixed connective tissue disease, positive rheumatoid factor, ANA positive, negative RNP. 2. Inflammatory myositis. 3. Peripheral neuropathy.  4. Interstitial lung disease. 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  8. Microcytic anemia.  9. Bronchiectasis/emphysema/chronic obstructive  pulmonary disease.  10. Diabetes.  11. Gastroesophageal reflux disease.  12. Osteoporosis.   PAST SURGICAL HISTORY: Right knee fracture.   MEDICATIONS:  1. Advair 250/50, 1 puff 2 times a day.  2. Alendronate 70 mg once a week.  3. Losartan 50 mg oral daily.  4. Lovastatin 20 mg at bedtime.  5. Metformin 500 mg twice a day. 6. Nasonex spray both nostrils daily.  7. Omeprazole 20 mg daily.  8. Spiriva 18 mcg daily inhalational.  9. Ventolin p.r.n. 3 times a day.  10. Leflunomide 20 mg oral daily.  11. Hydroxychloroquine 200 mg oral daily.  12. Prednisone 5 mg oral daily. She is on this dose chronically. 13. Alka-Seltzer tablet.  14. Megace 1 tsp by mouth daily.   ALLERGIES: Penicillin causing rash.   SOCIAL HISTORY: Patient does not drink nor smoke. Lives with her daughter.   REVIEW OF SYSTEMS: CONSTITUTIONAL: Patient has complaint of fever, fatigue, weakness. EYES: Denies blurry vision, double vision, or pain. ENT: Denies any tinnitus, ear pain. Has chronic hair loss. RESPIRATORY: Has complaint of cough, shortness of breath. Denies any hemoptysis. CARDIOVASCULAR: Denies any chest pain, orthopnea, edema, palpitations. GASTROINTESTINAL: Complains of nausea, vomiting. Denies any diarrhea, abdominal pain, hematemesis. GENITOURINARY: Denies any dysuria, polyuria, hematuria, renal colic. ENDO: Denies any polyuria, polydipsia, heat or cold intolerance. HEMATOLOGY: Has history of anemia. Denies any easy bruising, bleeding diathesis, or blood clots. NEUROLOGIC: Denies any numbness. Has generalized weakness. Denies any epilepsy, tremors, vertigo. PSYCH: Denies alcohol or drug abuse, any insomnia or depression.   PHYSICAL EXAMINATION:  VITAL SIGNS: Temperature 97.8, temperature max 102.5, pulse 78, respiratory rate 20, blood pressure 127/74, saturating 100% on 2 liters nasal cannula.  GENERAL: Thin appearing elderly female who is comfortable in bed in no apparent distress.   HEENT: Head  atraumatic, normocephalic. Pupils equal, reactive to light. Pink conjunctivae. Anicteric sclerae. Moist oral mucosa.   NECK: Supple. No thyromegaly. No JVD.   CHEST: Fair air entry bilaterally with diffuse rales.   CARDIOVASCULAR: S1, S2 heard. No rubs, murmur, gallops.   ABDOMEN: Soft, nontender, nondistended. Bowel sounds present.   EXTREMITIES: No edema. No clubbing. No cyanosis.   INTEGUMENTARY: Patient had hypopigmentation lesions, reportedly this is from ulcer, has been chronic.   PSYCHIATRIC: Appropriate affect. Awake, alert x3. Intact judgment and insight.   NEUROLOGICAL: Cranial nerves grossly intact. Motor 5/5.   LABORATORY, DIAGNOSTIC AND RADIOLOGICAL DATA: Glucose 126, BUN 11, creatinine 0.68, sodium 133, potassium 3.6, chloride 98, CO2 22, total protein 8, albumin 3.3, troponin less than 0.02 x 2. White blood cell 8.9, hemoglobin 12.3, hematocrit 38, platelets 194. Urinalysis showing +3 leukocyte esterase, white blood cell count 13.    ASSESSMENT AND PLAN: This is a 69 year old female who presents with cough, fever, has right lower lung infiltrate secondary to pneumonia.  1. Pneumonia. Blood cultures were sent. Patient will to be started on Levaquin IV. Will send sputum cultures.  2. Urinary tract infection. Patient has positive urinalysis. Will continue on Levaquin. Will follow urine culture.  3. Connective tissue disease, lupus/rheumatoid arthritis. Will continue patient on immunosuppressive medications: Prednisone 5 mg daily, leflunomide 20 mg oral daily and Plaquenil 200 mg daily.  4. Chronic respiratory failure. This is secondary to lung disease from connective tissue disease. Will continue her on 2 liters nasal cannula and will continue her on Advair, Spiriva and nebulizers.  5. Osteoporosis. Patient can continue Fosamax when she is discharged home.  6. Anemia. Currently patient's hemoglobin within normal level.  7. Diabetes mellitus. Will hold oral hypoglycemic agents.  Continue her on sliding scale.  8. Hypertension. Will continue patient on losartan.  9. CODE STATUS: Patient is FULL CODE.   TOTAL TIME SPENT ON PATIENT CARE: 55 minutes.    ____________________________ Starleen Armsawood S. Mattie Novosel, MD dse:cms D: 09/15/2011 05:26:20 ET T: 09/15/2011 08:17:17 ET JOB#: [id number]  cc: Starleen Armsawood S. Mcguire Gasparyan, MD, <Dictator> Duanne Limerickeanna C. Jones, MD Arnika Larzelere Teena IraniS Kalia Vahey MD ELECTRONICALLY SIGNED 10/01/2011 0:46

## 2014-08-18 NOTE — Consult Note (Signed)
Pt with mixed connective tissue disease, takes 4 Goodys a day or equivalent in advil.  She has poor appetite for several days, presented with cough, fever, has pneumonia in right lower lung area with insp rales and egophony.  Previous EGD several years ago showed multiple gastric erosions.  Would give her full liquid diet with glucerna and advance to mechanical soft diabetic diet.  She has run out of iv sites and may need central line.  Consider EGD before discharge or later as out patient.  Will follow with you.  Electronic Signatures: Scot JunElliott, Mery Guadalupe T (MD)  (Signed on 22-May-13 19:07)  Authored  Last Updated: 22-May-13 19:07 by Scot JunElliott, Rhyen Mazariego T (MD)
